# Patient Record
Sex: Female | Born: 2000 | Race: White | Hispanic: No | Marital: Single | Smoking: Never smoker
Health system: Southern US, Community
[De-identification: ages and names within clinical notes are randomized; demographics above are authoritative.]

## PROBLEM LIST (undated history)

## (undated) DIAGNOSIS — F419 Anxiety disorder, unspecified: Secondary | ICD-10-CM

## (undated) DIAGNOSIS — I7300 Raynaud's syndrome without gangrene: Secondary | ICD-10-CM

## (undated) DIAGNOSIS — F32A Depression, unspecified: Secondary | ICD-10-CM

## (undated) DIAGNOSIS — F909 Attention-deficit hyperactivity disorder, unspecified type: Secondary | ICD-10-CM

## (undated) DIAGNOSIS — I10 Essential (primary) hypertension: Secondary | ICD-10-CM

## (undated) DIAGNOSIS — D649 Anemia, unspecified: Secondary | ICD-10-CM

## (undated) HISTORY — PX: WISDOM TOOTH EXTRACTION: SHX21

## (undated) HISTORY — PX: TONSILLECTOMY: SUR1361

---

## 2021-12-24 ENCOUNTER — Emergency Department (HOSPITAL_COMMUNITY)

## 2021-12-24 ENCOUNTER — Other Ambulatory Visit: Payer: Self-pay

## 2021-12-24 ENCOUNTER — Encounter (HOSPITAL_COMMUNITY): Payer: Self-pay | Admitting: *Deleted

## 2021-12-24 ENCOUNTER — Emergency Department (HOSPITAL_COMMUNITY)
Admission: EM | Admit: 2021-12-24 | Discharge: 2021-12-24 | Disposition: A | Discharge status: Home / Self Care | Attending: Emergency Medicine | Admitting: Emergency Medicine

## 2021-12-24 DIAGNOSIS — R000 Tachycardia, unspecified: Secondary | ICD-10-CM | POA: Diagnosis not present

## 2021-12-24 DIAGNOSIS — R42 Dizziness and giddiness: Secondary | ICD-10-CM | POA: Insufficient documentation

## 2021-12-24 DIAGNOSIS — Z79899 Other long term (current) drug therapy: Secondary | ICD-10-CM | POA: Insufficient documentation

## 2021-12-24 DIAGNOSIS — Z91040 Latex allergy status: Secondary | ICD-10-CM | POA: Diagnosis not present

## 2021-12-24 DIAGNOSIS — H9211 Otorrhea, right ear: Secondary | ICD-10-CM

## 2021-12-24 HISTORY — DX: Essential (primary) hypertension: I10

## 2021-12-24 HISTORY — DX: Attention-deficit hyperactivity disorder, unspecified type: F90.9

## 2021-12-24 HISTORY — DX: Anxiety disorder, unspecified: F41.9

## 2021-12-24 HISTORY — DX: Raynaud's syndrome without gangrene: I73.00

## 2021-12-24 HISTORY — DX: Anemia, unspecified: D64.9

## 2021-12-24 HISTORY — DX: Depression, unspecified: F32.A

## 2021-12-24 LAB — CBG MONITORING, ED: Glucose-Capillary: 73 mg/dL (ref 70–99)

## 2021-12-24 LAB — COMPREHENSIVE METABOLIC PANEL
ALT: 15 U/L (ref 0–44)
AST: 17 U/L (ref 15–41)
Albumin: 4.4 g/dL (ref 3.5–5.0)
Alkaline Phosphatase: 50 U/L (ref 38–126)
Anion gap: 8 (ref 5–15)
BUN: 8 mg/dL (ref 6–20)
CO2: 23 mmol/L (ref 22–32)
Calcium: 9.3 mg/dL (ref 8.9–10.3)
Chloride: 106 mmol/L (ref 98–111)
Creatinine, Ser: 0.69 mg/dL (ref 0.44–1.00)
GFR, Estimated: >60 mL/min (ref >60)
Glucose, Bld: 89 mg/dL (ref 70–99)
Potassium: 3.7 mmol/L (ref 3.5–5.1)
Sodium: 137 mmol/L (ref 135–145)
Total Bilirubin: 0.7 mg/dL (ref 0.3–1.2)
Total Protein: 7.2 g/dL (ref 6.5–8.1)

## 2021-12-24 LAB — CBC WITH DIFFERENTIAL/PLATELET
Abs Immature Granulocytes: 0.01 10*3/uL (ref 0.00–0.07)
Basophils Absolute: 0 10*3/uL (ref 0.0–0.1)
Basophils Relative: 1 %
Eosinophils Absolute: 0.1 10*3/uL (ref 0.0–0.5)
Eosinophils Relative: 1 %
HCT: 40 % (ref 36.0–46.0)
Hemoglobin: 13.2 g/dL (ref 12.0–15.0)
Immature Granulocytes: 0 %
Lymphocytes Relative: 24 %
Lymphs Abs: 1.8 10*3/uL (ref 0.7–4.0)
MCH: 30.6 pg (ref 26.0–34.0)
MCHC: 33 g/dL (ref 30.0–36.0)
MCV: 92.8 fL (ref 80.0–100.0)
Monocytes Absolute: 0.5 10*3/uL (ref 0.1–1.0)
Monocytes Relative: 7 %
Neutro Abs: 5 10*3/uL (ref 1.7–7.7)
Neutrophils Relative %: 67 %
Platelets: 284 10*3/uL (ref 150–400)
RBC: 4.31 MIL/uL (ref 3.87–5.11)
RDW: 13.2 % (ref 11.5–15.5)
WBC: 7.4 10*3/uL (ref 4.0–10.5)
nRBC: 0 % (ref 0.0–0.2)

## 2021-12-24 LAB — RAPID URINE DRUG SCREEN, HOSP PERFORMED
Amphetamines: POSITIVE — AB
Barbiturates: NOT DETECTED
Benzodiazepines: NOT DETECTED
Cocaine: NOT DETECTED
Opiates: NOT DETECTED
Tetrahydrocannabinol: POSITIVE — AB

## 2021-12-24 LAB — I-STAT BETA HCG BLOOD, ED (MC, WL, AP ONLY): I-stat hCG, quantitative: <5 m[IU]/mL (ref <5)

## 2021-12-24 LAB — TROPONIN I (HIGH SENSITIVITY): Troponin I (High Sensitivity): <2 ng/L (ref <18)

## 2021-12-24 LAB — TSH: TSH: 1.93 u[IU]/mL (ref 0.350–4.500)

## 2021-12-24 MED ORDER — OFLOXACIN 0.3 % OT SOLN: 3.0000 [drp] | Freq: Two times a day (BID) | OTIC | 0 refills | Status: AC

## 2021-12-24 NOTE — ED Notes (Signed)
Patient transported to CT 

## 2021-12-24 NOTE — ED Triage Notes (Signed)
Pt reports a few days of not feeling well, some dizziness, bleeding from her ear and headache. Not sure of any head injuries have occurred.

## 2021-12-24 NOTE — ED Notes (Signed)
CBG 73 

## 2021-12-24 NOTE — ED Provider Notes (Signed)
[facility]-EMERGENCY DEPT Provider Note   CSN: [id number] Arrival date & time: 12/24/21  1601     History  Chief Complaint  Patient presents with   Ear Drainage   Dizziness    Anna Buck is a 21 y.o. female.  Patient presents ER chief complaint of noticing blood coming out of her right ear for a day and a half now.  Also felt some dizziness lightheadedness.  Denies any fall or other injury.  Denies any pain.  Denies fevers or cough or vomiting or diarrhea.  She states she does clean her ears maybe 2 times a month with Q-tips but denies any trauma to the ear that she can recall within the last week.      Home Medications Prior to Admission medications   Medication Sig Start Date End Date Taking? Authorizing Provider  ofloxacin (FLOXIN) 0.3 % OTIC solution Place 3 drops into the right ear 2 (two) times daily for 7 days. 12/24/21 12/31/21 Yes Cheryll Cockayne, MD      Allergies    Latex, Milk-related compounds, and Nitrous oxide    Review of Systems   Review of Systems  Constitutional:  Negative for fever.  HENT:  Negative for ear pain.   Eyes:  Negative for pain.  Respiratory:  Negative for cough.   Cardiovascular:  Negative for chest pain.  Gastrointestinal:  Negative for abdominal pain.  Genitourinary:  Negative for flank pain.  Musculoskeletal:  Negative for back pain.  Skin:  Negative for rash.  Neurological:  Negative for headaches.   Physical Exam Updated Vital Signs BP 116/73 (BP Location: Right Arm)    Pulse (!) 116    Temp 98.3 F (36.8 C) (Oral)    Resp 18    Ht 5\' 3"  (1.6 m)    Wt 59 kg    LMP  (LMP Unknown)    SpO2 96%    BMI 23.03 kg/m  Physical Exam Constitutional:      General: She is not in acute distress.    Appearance: Normal appearance.  HENT:     Head: Normocephalic.     Ears:     Comments: Bilateral TMs are clear.  Right ear external canal has some dried blood.  No erythema noted no pain with manipulation of the tragus or  pinna of the ear no mastoid tenderness noted.  Visible portions of the tympanic membrane appear intact with complete visualization is difficult due to tried blood    Nose: Nose normal.  Eyes:     Extraocular Movements: Extraocular movements intact.  Cardiovascular:     Rate and Rhythm: Normal rate.  Pulmonary:     Effort: Pulmonary effort is normal.  Musculoskeletal:        General: Normal range of motion.     Cervical back: Normal range of motion.  Neurological:     General: No focal deficit present.     Mental Status: She is alert. Mental status is at baseline.    ED Results / Procedures / Treatments   Labs (all labs ordered are listed, but only abnormal results are displayed) Labs Reviewed  RAPID URINE DRUG SCREEN, HOSP PERFORMED - Abnormal; Notable for the following components:      Result Value   Amphetamines POSITIVE (*)    Tetrahydrocannabinol POSITIVE (*)    All other components within normal limits  CBC WITH DIFFERENTIAL/PLATELET  COMPREHENSIVE METABOLIC PANEL  TSH  CBG MONITORING, ED  I-STAT BETA HCG BLOOD, ED (  [facility], [facility], [facility] ONLY)  TROPONIN I (HIGH SENSITIVITY)    EKG EKG Interpretation  Date/Time:  Wednesday December 24 2021 16:46:12 EST Ventricular Rate:  113 PR Interval:  173 QRS Duration: 86 QT Interval:  322 QTC Calculation: 442 R Axis:   70 Text Interpretation: Sinus tachycardia Confirmed by Norman Clay ([id number]) on 12/24/2021 6:31:51 PM  Radiology DG Chest 2 View  Result Date: 12/24/2021 CLINICAL DATA:  Chest pain EXAM: CHEST - 2 VIEW COMPARISON:  None. FINDINGS: The heart size and mediastinal contours are within normal limits. Both lungs are clear. The visualized skeletal structures are unremarkable. IMPRESSION: No active cardiopulmonary disease. Electronically Signed   By: Jasmine Pang M.D.   On: 12/24/2021 17:05   CT HEAD WO CONTRAST ( )  Result Date: 12/24/2021 CLINICAL DATA:  Provided history: Head trauma, abnormal mental status. Recent  questionable head trauma with confusion/memory issues and vertigo and disequilibrium. EXAM: CT HEAD WITHOUT CONTRAST TECHNIQUE: Contiguous axial images were obtained from the base of the skull through the vertex without intravenous contrast. RADIATION DOSE REDUCTION: This exam was performed according to the departmental dose-optimization program which includes automated exposure control, adjustment of the mA and/or kV according to patient size and/or use of iterative reconstruction technique. COMPARISON:  No pertinent prior exams available for comparison. FINDINGS: Brain: Cerebral volume is normal. There is no acute intracranial hemorrhage. No demarcated cortical infarct. No extra-axial fluid collection. No evidence of an intracranial mass. No midline shift. Vascular: No hyperdense vessel. Skull: Normal. Negative for fracture or focal lesion. Sinuses/Orbits: Visualized orbits show no acute finding. No significant paranasal sinus disease at the imaged levels. IMPRESSION: Unremarkable non-contrast CT appearance of the brain. No evidence of acute intracranial abnormality. Electronically Signed   By: Jackey Loge D.O.   On: 12/24/2021 17:13    Procedures Procedures    Medications Ordered in ED Medications - No data to display  ED Course/ Medical Decision Making/ A&P                           Medical Decision Making  Patient seen in triage multiple labs have been sent urine test is negative.  Drug screen is positive for amphetamines and cannabinoids.  Troponin is negative labs otherwise unremarkable.  CT imaging of the head and chest x-ray unremarkable as well.  Patient persistently mildly tachycardic but otherwise has no other symptoms.  She has no focal neurodeficit is amatory without discomfort or weakness.  Recommending outpatient follow-up with ENT within the week.  Given a prescription of eardrops to take in the meanwhile.  Advised return for worsening symptoms fevers pain or any additional  concerns.        Final Clinical Impression(s) / ED Diagnoses Final diagnoses:  Otorrhea of right ear  Dizziness    Rx / DC Orders ED Discharge Orders          Ordered    ofloxacin (FLOXIN) 0.3 % OTIC solution  2 times daily        12/24/21 1953              Cheryll Cockayne, MD 12/24/21 [phone]

## 2021-12-24 NOTE — ED Notes (Signed)
Pt sitting in recliner blowing up gloves like a balloon in no apparent distress.

## 2021-12-24 NOTE — Discharge Instructions (Signed)
Call your primary care doctor or specialist as discussed in the next 2-3 days.   Return immediately back to the ER if:  Your symptoms worsen within the next 12-24 hours. You develop new symptoms such as new fevers, persistent vomiting, new pain, shortness of breath, or new weakness or numbness, or if you have any other concerns.  

## 2021-12-24 NOTE — ED Provider Triage Note (Addendum)
Emergency Medicine Provider Triage Evaluation Note  Anna Buck , a 21 y.o. female  was evaluated in triage.  Pt complains of 2-3 days of confusion.   States she may have hit her head "it's possible" endorses marijuanna use but no other rec drug use. No etoh this month.   Endorses CP states it's not new.   Has adhd and other mental health issues (anxiety, depression, etc).   Review of Systems  Positive: CP, dizziness, right ear pain/bleeding. Headache Negative: Fever   Physical Exam  BP 133/90 (BP Location: Left Arm)    Pulse (!) 116    Temp 98.3 F (36.8 C) (Oral)    Resp 18    SpO2 96%  Gen:   Awake, no distress   Resp:  Normal effort  MSK:   Moves extremities without difficulty  Other:  Tachycardia. Smile symmetric. Grip symmetric. Sensation intact all extremities.   Medical Decision Making  Medically screening exam initiated at 4:27 PM.  Appropriate orders placed.  Anna Buck was informed that the remainder of the evaluation will be completed by another provider, this initial triage assessment does not replace that evaluation, and the importance of remaining in the ED until their evaluation is complete.  Pt very anxious. Numerous symptoms over the past 2 days.   Feels very off balance. But neuro intact on my exam. Mildly tachycardic.   Patient is a very nonlinear historian.  Seems quite anxious and has a wide array of symptoms.  Anna Buck, [place] 12/24/21 1639    Anna Buck, [place] 12/24/21 1641

## 2021-12-24 NOTE — ED Notes (Signed)
Dc instructions and scripts reviewed with pt no questions or concerns at this time. Will follow up with ENT 

## 2023-01-11 IMAGING — CR DG CHEST 2V
2 series · 2 of 2 positions shown · non-contrast
Comparison: None.

CLINICAL DATA: Chest pain

EXAM:
CHEST - 2 VIEW

[w chest pa]
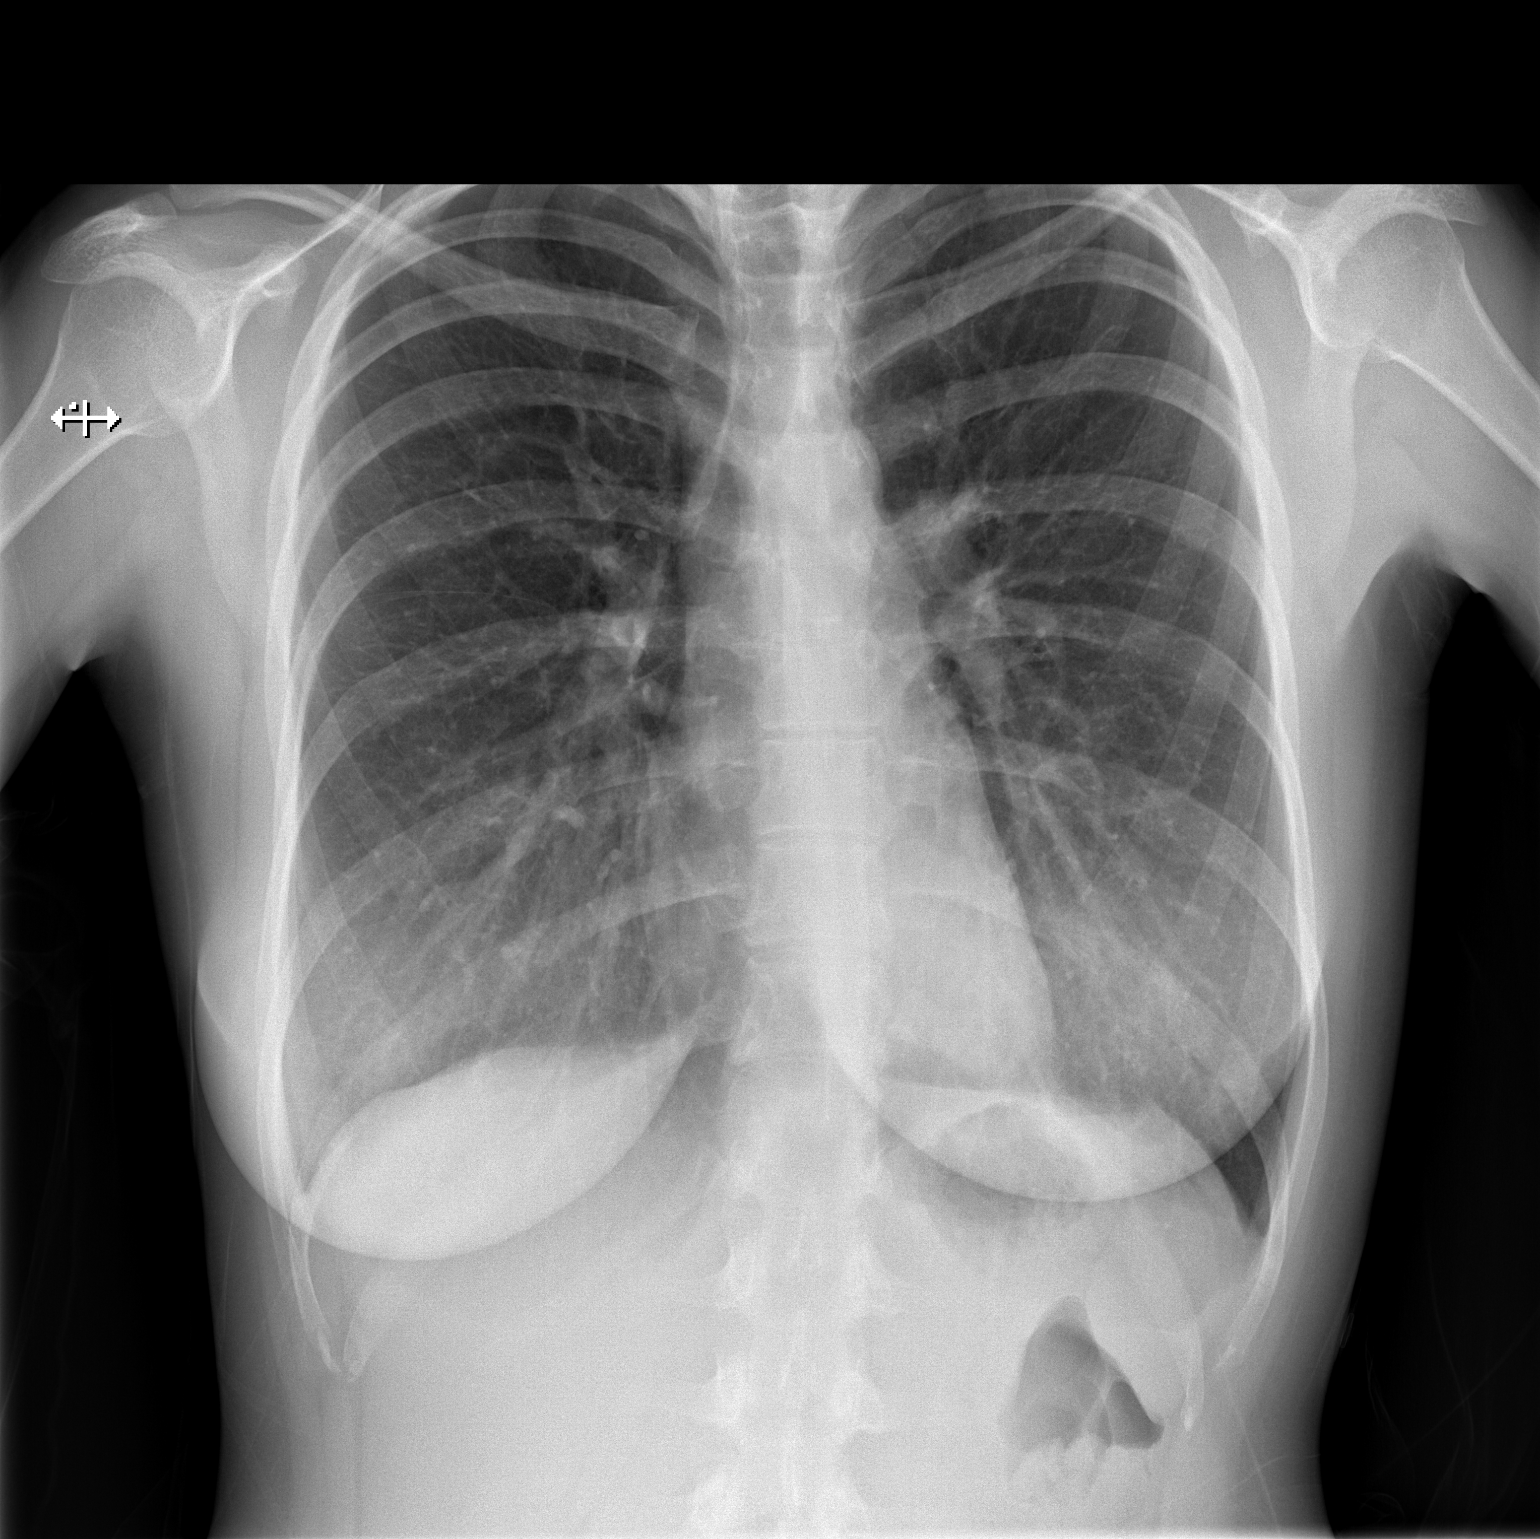

[w chest lat]
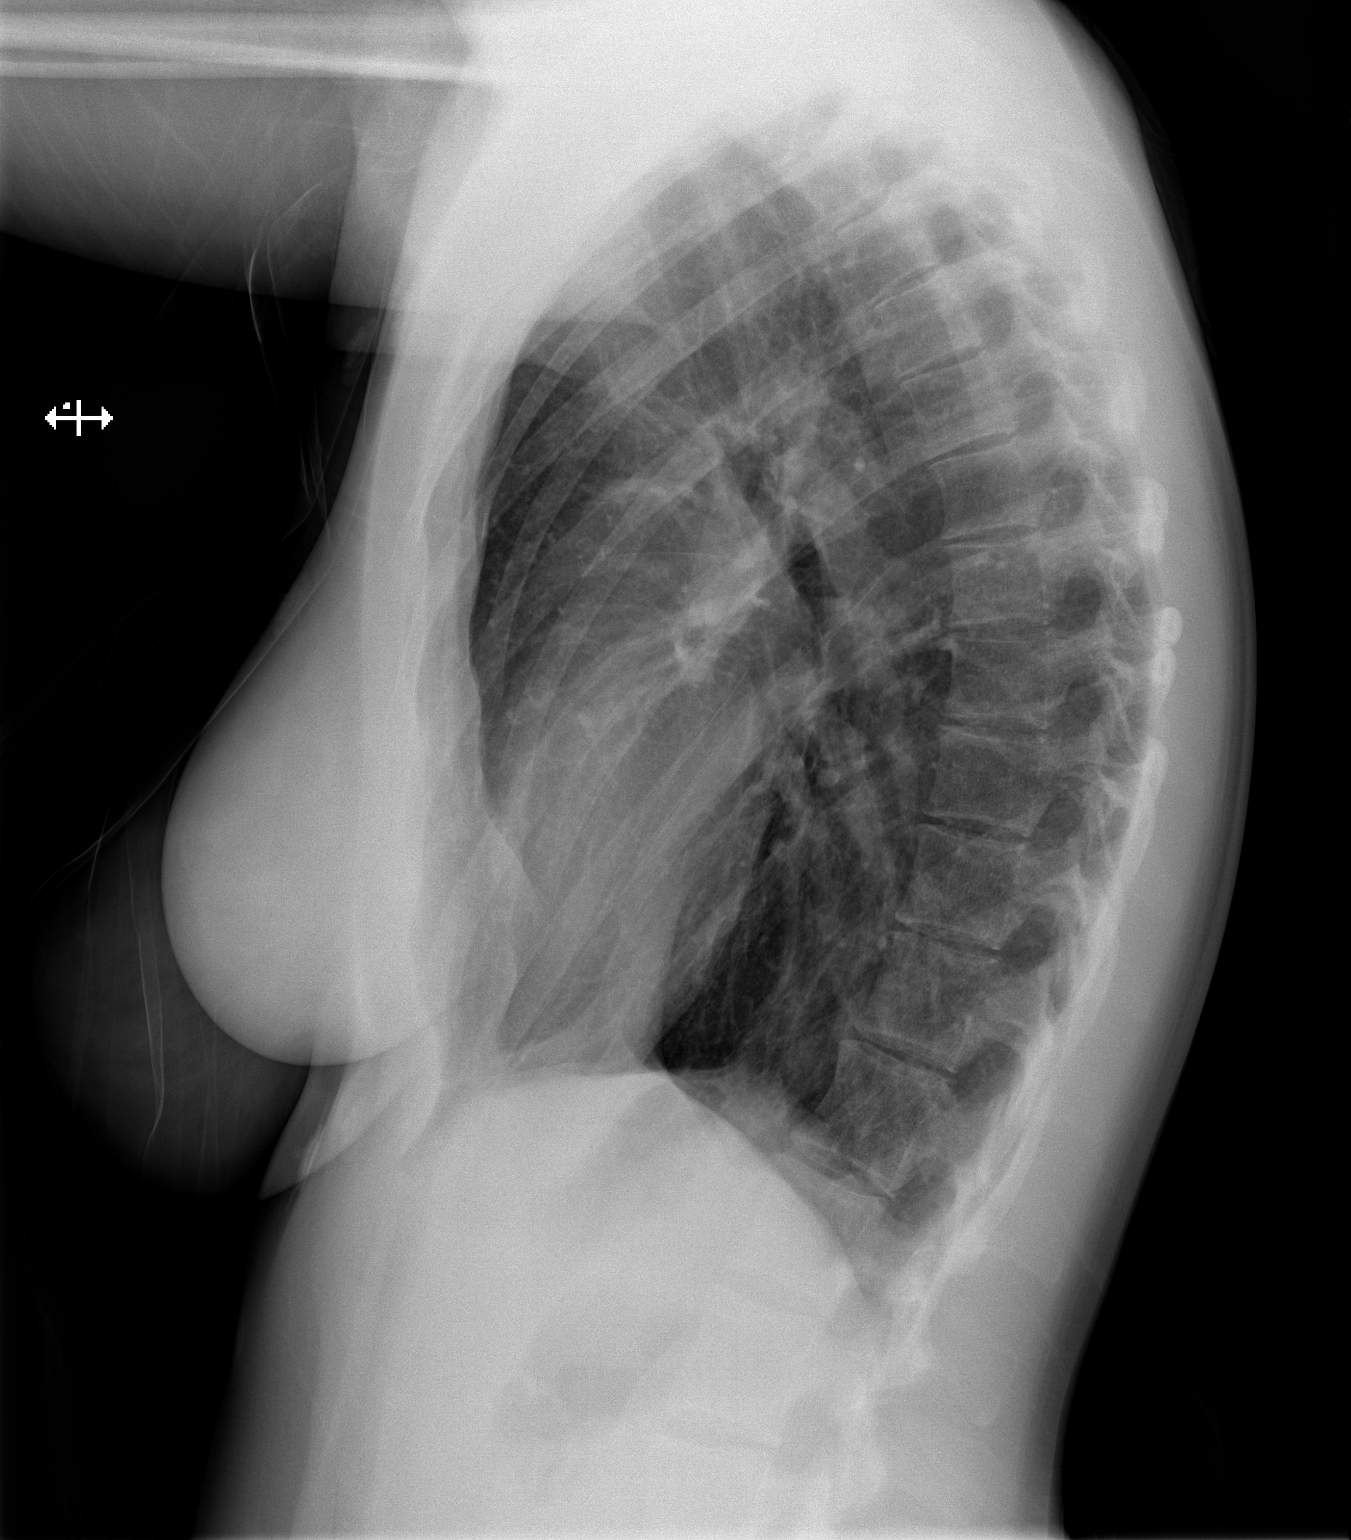

[2 of 2 positions shown; findings below may reference images not displayed]

FINDINGS: The heart size and mediastinal contours are within normal limits.
Both lungs are clear. The visualized skeletal structures are
unremarkable.
IMPRESSION: No active cardiopulmonary disease.

## 2023-01-11 IMAGING — CT CT HEAD W/O CM
3 series · 14 of 47 positions shown, 16 images · non-contrast
Comparison: No pertinent prior exams available for comparison.

CLINICAL DATA: Provided history: Head trauma, abnormal mental
status. Recent questionable head trauma with confusion/memory issues
and vertigo and disequilibrium.



[Series 2: head wo · axial · 0.47mm/px · z∈[-148,-23]mm · 8 of 30 slices shown, 10 images]
[im 3/30  brain]
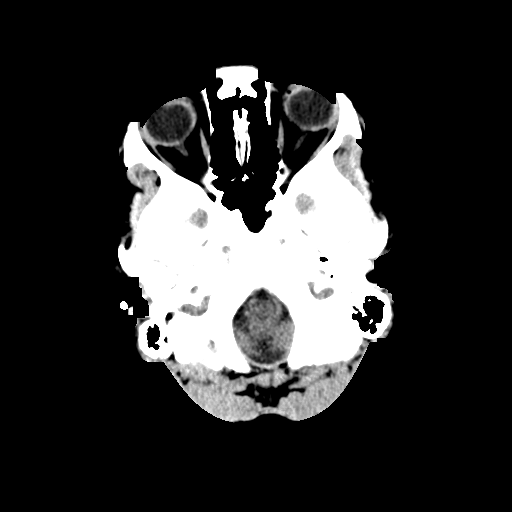
[im 3/30  bone]
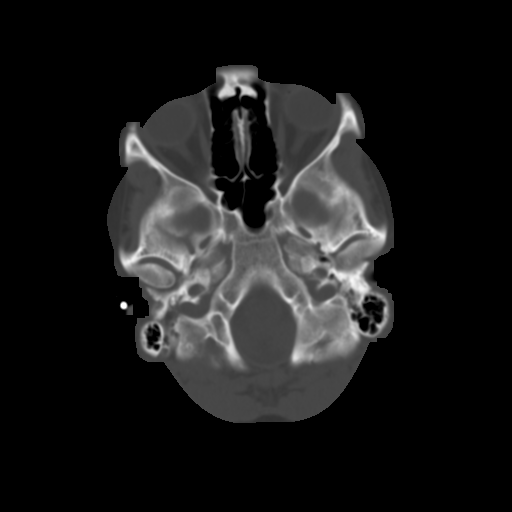
[im 7/30  brain]
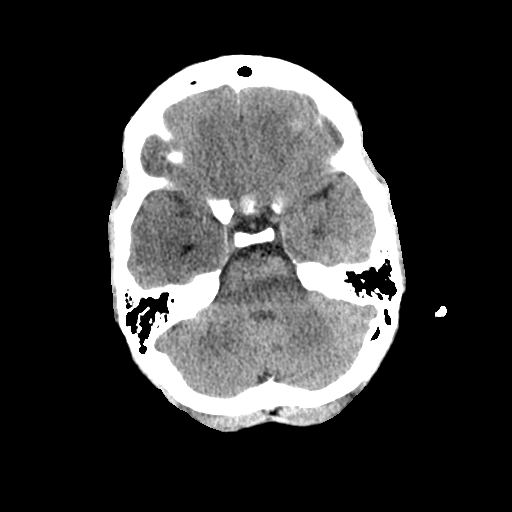
[im 10/30  brain]
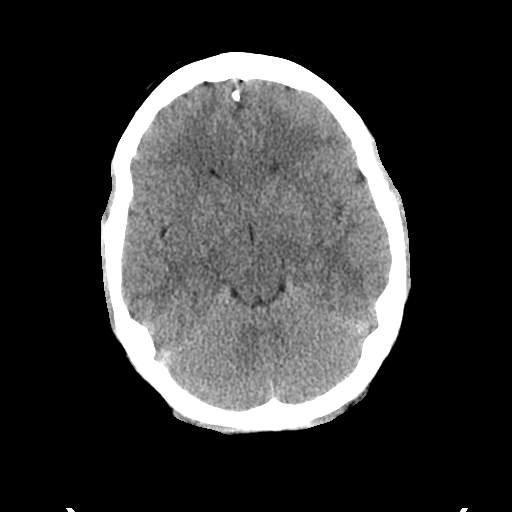
[im 14/30  brain]
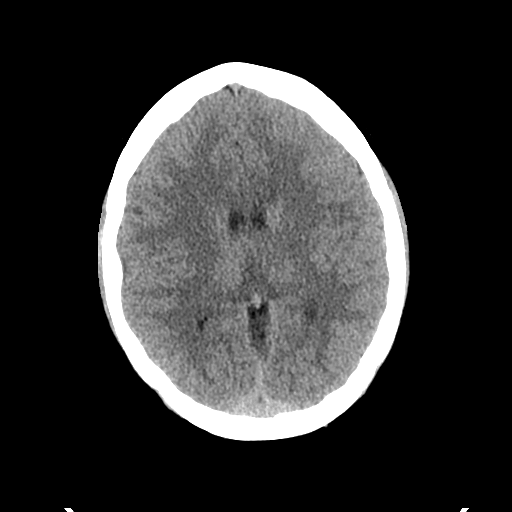
[im 17/30  brain]
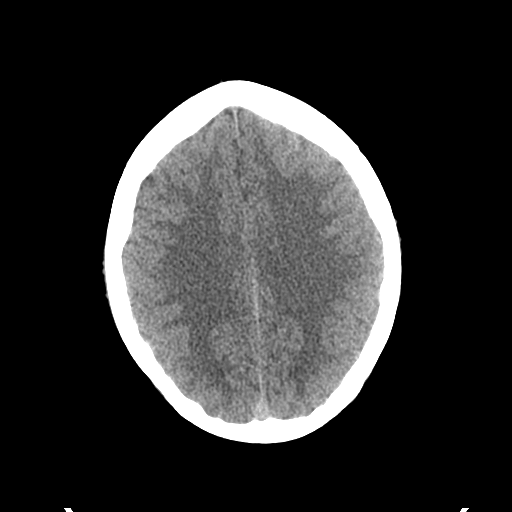
[im 17/30  bone]
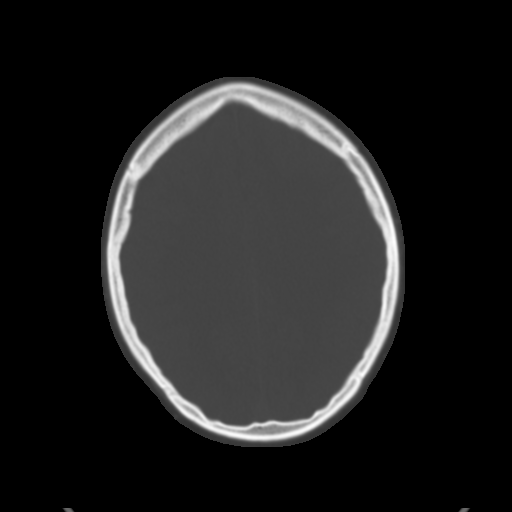
[im 21/30  brain]
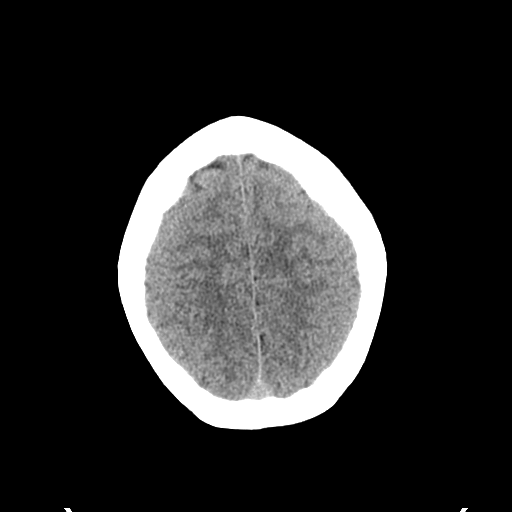
[im 24/30  brain]
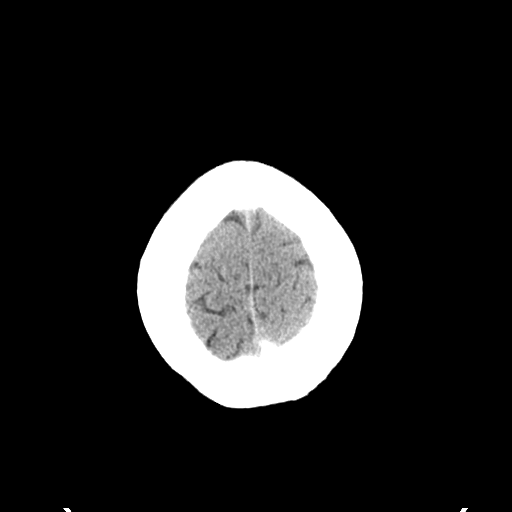
[im 28/30  brain]
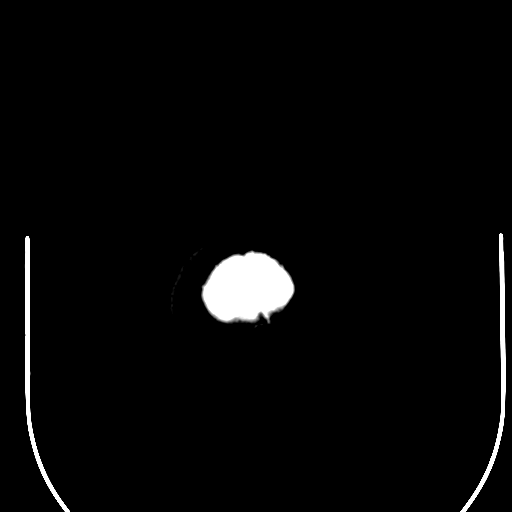

[Series 4: coronal soft tissue · coronal · 0.30mm/px · 3 of 63 slices shown]
[im 21/63  brain]
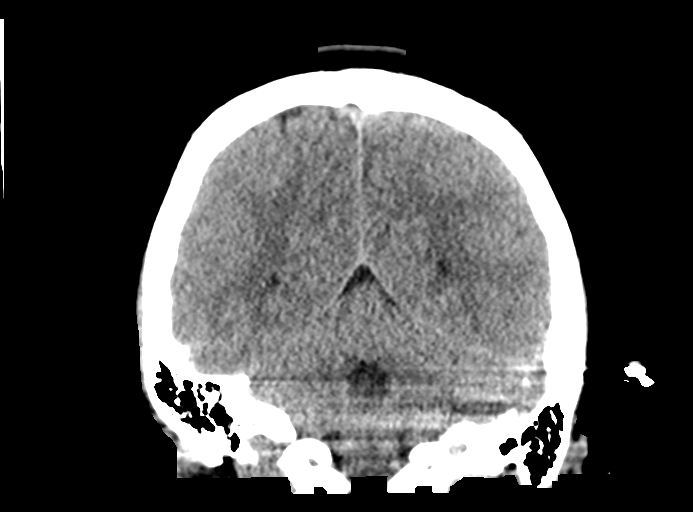
[im 28/63  brain]
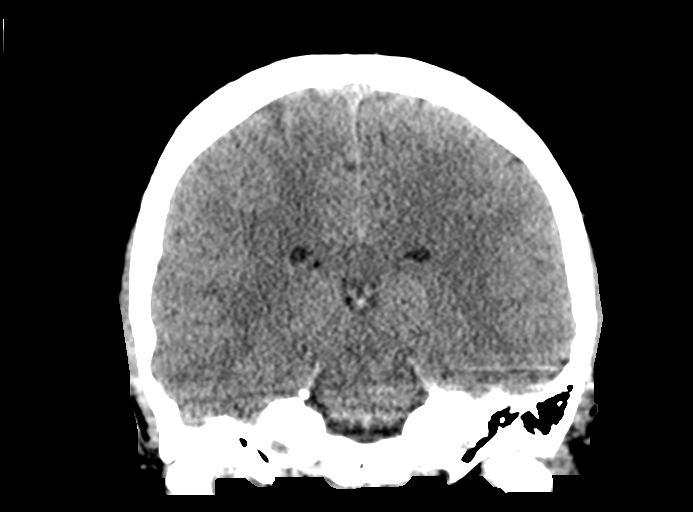
[im 35/63  brain]
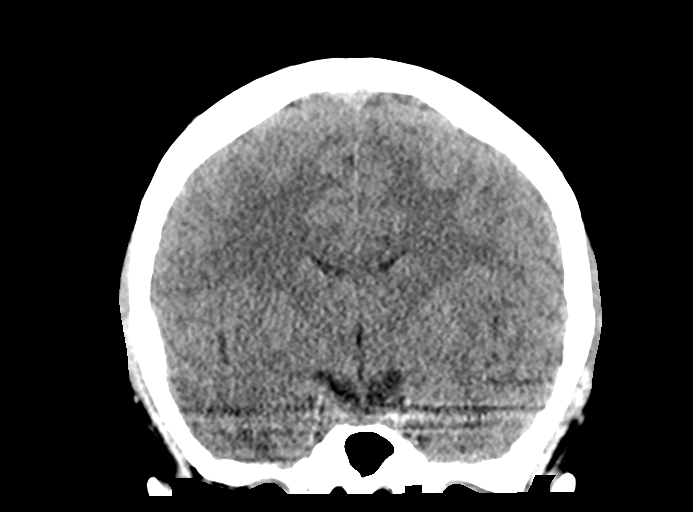

[Series 5: sagittal soft tissue · sagittal · 0.32mm/px · 3 of 52 slices shown]
[im 18/52  brain]
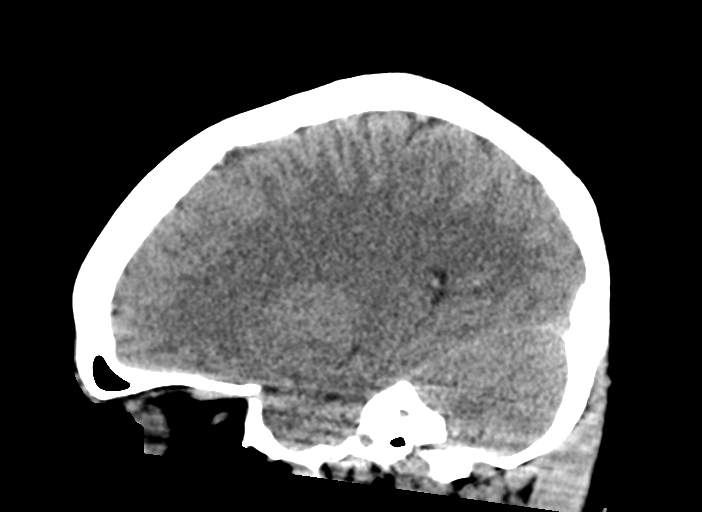
[im 26/52  brain]
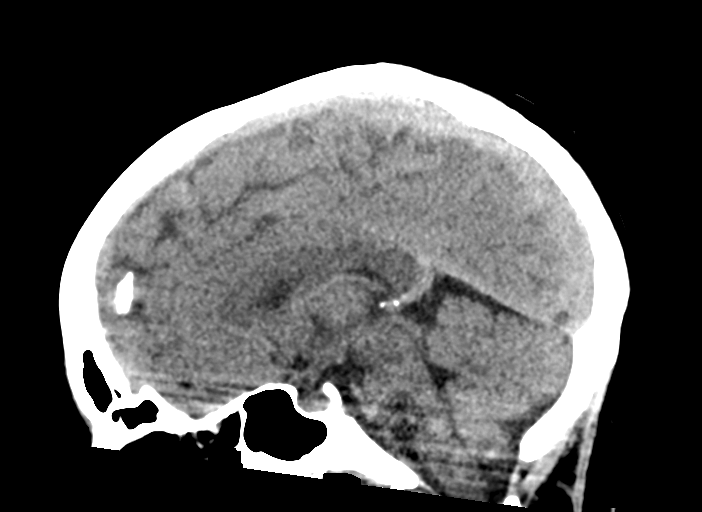
[im 35/52  brain]
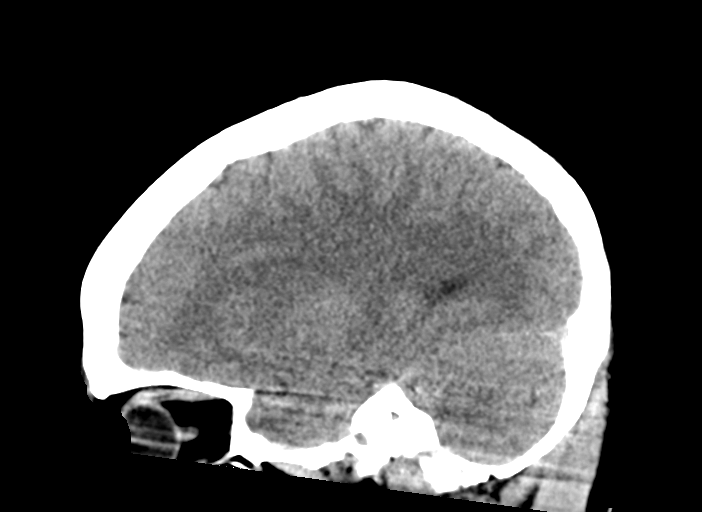

[14 of 47 positions shown; findings below may reference images not displayed]

FINDINGS: Brain:

Cerebral volume is normal.

There is no acute intracranial hemorrhage.

No demarcated cortical infarct.

No extra-axial fluid collection.

No evidence of an intracranial mass.

No midline shift.

Vascular: No hyperdense vessel.

Skull: Normal. Negative for fracture or focal lesion.

Sinuses/Orbits: Visualized orbits show no acute finding. No
significant paranasal sinus disease at the imaged levels.
IMPRESSION: Unremarkable non-contrast CT appearance of the brain. No evidence of
acute intracranial abnormality.
